# Patient Record
Sex: Female | Born: 1935 | Race: White | Hispanic: No | Marital: Married | Smoking: Never smoker
Health system: Southern US, Community
[De-identification: ages and names within clinical notes are randomized; demographics above are authoritative.]

## PROBLEM LIST (undated history)

## (undated) HISTORY — PX: ABDOMINAL HYSTERECTOMY: SHX81

## (undated) HISTORY — PX: CYST REMOVAL HAND: SHX6279

## (undated) HISTORY — PX: OTHER SURGICAL HISTORY: SHX169

## (undated) HISTORY — PX: APPENDECTOMY: SHX54

---

## 2002-10-27 ENCOUNTER — Encounter: Payer: Self-pay | Admitting: Hematology

## 2002-10-27 ENCOUNTER — Encounter: Admission: RE | Admit: 2002-10-27 | Discharge: 2002-10-27 | Payer: Self-pay | Admitting: Hematology

## 2002-11-15 ENCOUNTER — Other Ambulatory Visit: Admission: RE | Admit: 2002-11-15 | Discharge: 2002-11-15 | Payer: Self-pay | Admitting: Oncology

## 2004-01-25 ENCOUNTER — Ambulatory Visit: Payer: Self-pay | Admitting: Oncology

## 2015-05-29 DIAGNOSIS — N184 Chronic kidney disease, stage 4 (severe): Secondary | ICD-10-CM

## 2015-05-29 DIAGNOSIS — N2581 Secondary hyperparathyroidism of renal origin: Secondary | ICD-10-CM

## 2015-05-29 HISTORY — DX: Secondary hyperparathyroidism of renal origin: N25.81

## 2015-05-29 HISTORY — DX: Chronic kidney disease, stage 4 (severe): N18.4

## 2015-05-30 DIAGNOSIS — D472 Monoclonal gammopathy: Secondary | ICD-10-CM

## 2015-05-30 DIAGNOSIS — M8580 Other specified disorders of bone density and structure, unspecified site: Secondary | ICD-10-CM | POA: Insufficient documentation

## 2015-05-30 DIAGNOSIS — R7989 Other specified abnormal findings of blood chemistry: Secondary | ICD-10-CM

## 2015-05-30 DIAGNOSIS — M1A09X0 Idiopathic chronic gout, multiple sites, without tophus (tophi): Secondary | ICD-10-CM | POA: Insufficient documentation

## 2015-05-30 DIAGNOSIS — K219 Gastro-esophageal reflux disease without esophagitis: Secondary | ICD-10-CM | POA: Insufficient documentation

## 2015-05-30 DIAGNOSIS — G43909 Migraine, unspecified, not intractable, without status migrainosus: Secondary | ICD-10-CM | POA: Insufficient documentation

## 2015-05-30 HISTORY — DX: Migraine, unspecified, not intractable, without status migrainosus: G43.909

## 2015-05-30 HISTORY — DX: Idiopathic chronic gout, multiple sites, without tophus (tophi): M1A.09X0

## 2015-05-30 HISTORY — DX: Gastro-esophageal reflux disease without esophagitis: K21.9

## 2015-05-30 HISTORY — DX: Other specified disorders of bone density and structure, unspecified site: M85.80

## 2015-05-30 HISTORY — DX: Monoclonal gammopathy: D47.2

## 2015-05-30 HISTORY — DX: Other specified abnormal findings of blood chemistry: R79.89

## 2015-11-22 DIAGNOSIS — G2581 Restless legs syndrome: Secondary | ICD-10-CM

## 2015-11-22 DIAGNOSIS — N184 Chronic kidney disease, stage 4 (severe): Secondary | ICD-10-CM | POA: Insufficient documentation

## 2015-11-22 DIAGNOSIS — E1122 Type 2 diabetes mellitus with diabetic chronic kidney disease: Secondary | ICD-10-CM

## 2015-11-22 HISTORY — DX: Restless legs syndrome: G25.81

## 2015-11-22 HISTORY — DX: Chronic kidney disease, stage 4 (severe): N18.4

## 2015-11-22 HISTORY — DX: Chronic kidney disease, stage 4 (severe): E11.22

## 2016-10-05 DIAGNOSIS — T485X5A Adverse effect of other anti-common-cold drugs, initial encounter: Secondary | ICD-10-CM | POA: Insufficient documentation

## 2016-10-05 DIAGNOSIS — J310 Chronic rhinitis: Secondary | ICD-10-CM

## 2016-10-05 HISTORY — DX: Chronic rhinitis: J31.0

## 2016-10-05 HISTORY — DX: Adverse effect of other anti-common-cold drugs, initial encounter: T48.5X5A

## 2017-09-16 DIAGNOSIS — K915 Postcholecystectomy syndrome: Secondary | ICD-10-CM

## 2017-09-16 DIAGNOSIS — M5416 Radiculopathy, lumbar region: Secondary | ICD-10-CM | POA: Insufficient documentation

## 2017-09-16 HISTORY — DX: Radiculopathy, lumbar region: M54.16

## 2017-09-16 HISTORY — DX: Postcholecystectomy syndrome: K91.5

## 2018-12-14 DIAGNOSIS — E782 Mixed hyperlipidemia: Secondary | ICD-10-CM | POA: Insufficient documentation

## 2018-12-14 HISTORY — DX: Mixed hyperlipidemia: E78.2

## 2019-07-20 DIAGNOSIS — R928 Other abnormal and inconclusive findings on diagnostic imaging of breast: Secondary | ICD-10-CM

## 2019-07-20 HISTORY — DX: Other abnormal and inconclusive findings on diagnostic imaging of breast: R92.8

## 2019-09-21 ENCOUNTER — Other Ambulatory Visit: Payer: Self-pay | Admitting: Nephrology

## 2019-09-21 DIAGNOSIS — N184 Chronic kidney disease, stage 4 (severe): Secondary | ICD-10-CM

## 2019-09-27 ENCOUNTER — Other Ambulatory Visit: Payer: Self-pay

## 2019-10-09 ENCOUNTER — Ambulatory Visit
Admission: RE | Admit: 2019-10-09 | Discharge: 2019-10-09 | Disposition: A | Discharge status: Home / Self Care | Payer: Medicare PPO | Source: Ambulatory Visit | Attending: Nephrology | Admitting: Nephrology

## 2019-10-09 DIAGNOSIS — N184 Chronic kidney disease, stage 4 (severe): Secondary | ICD-10-CM

## 2019-12-07 DIAGNOSIS — I674 Hypertensive encephalopathy: Secondary | ICD-10-CM | POA: Insufficient documentation

## 2019-12-07 DIAGNOSIS — I361 Nonrheumatic tricuspid (valve) insufficiency: Secondary | ICD-10-CM | POA: Diagnosis not present

## 2019-12-09 DIAGNOSIS — I4510 Unspecified right bundle-branch block: Secondary | ICD-10-CM | POA: Diagnosis not present

## 2019-12-10 DIAGNOSIS — I4510 Unspecified right bundle-branch block: Secondary | ICD-10-CM | POA: Diagnosis not present

## 2019-12-13 DIAGNOSIS — I161 Hypertensive emergency: Secondary | ICD-10-CM | POA: Insufficient documentation

## 2019-12-13 DIAGNOSIS — G9340 Encephalopathy, unspecified: Secondary | ICD-10-CM | POA: Insufficient documentation

## 2019-12-13 DIAGNOSIS — R4701 Aphasia: Secondary | ICD-10-CM

## 2019-12-13 DIAGNOSIS — R4781 Slurred speech: Secondary | ICD-10-CM | POA: Insufficient documentation

## 2019-12-13 HISTORY — DX: Hypertensive emergency: I16.1

## 2019-12-13 HISTORY — DX: Aphasia: R47.01

## 2019-12-13 HISTORY — DX: Encephalopathy, unspecified: G93.40

## 2019-12-13 HISTORY — DX: Slurred speech: R47.81

## 2019-12-19 ENCOUNTER — Other Ambulatory Visit: Payer: Self-pay

## 2019-12-20 ENCOUNTER — Other Ambulatory Visit: Payer: Self-pay

## 2019-12-20 ENCOUNTER — Ambulatory Visit (INDEPENDENT_AMBULATORY_CARE_PROVIDER_SITE_OTHER): Payer: Medicare PPO

## 2019-12-20 ENCOUNTER — Ambulatory Visit (INDEPENDENT_AMBULATORY_CARE_PROVIDER_SITE_OTHER): Payer: Medicare PPO | Admitting: Cardiology

## 2019-12-20 ENCOUNTER — Encounter: Payer: Self-pay | Admitting: Cardiology

## 2019-12-20 VITALS — BP 124/78 | HR 63 | Ht 65.0 in | Wt 169.8 lb

## 2019-12-20 DIAGNOSIS — I10 Essential (primary) hypertension: Secondary | ICD-10-CM

## 2019-12-20 DIAGNOSIS — Z8673 Personal history of transient ischemic attack (TIA), and cerebral infarction without residual deficits: Secondary | ICD-10-CM

## 2019-12-20 DIAGNOSIS — E8881 Metabolic syndrome: Secondary | ICD-10-CM | POA: Diagnosis not present

## 2019-12-20 DIAGNOSIS — I493 Ventricular premature depolarization: Secondary | ICD-10-CM

## 2019-12-20 NOTE — Progress Notes (Signed)
Cardiology Office Note:    Date:  12/21/2019   ID:  Monica Vega, DOB 08/19/1932, MRN [id number]  PCP:  Maris Berger, MD  Cardiologist:  Berniece Salines, DO  Electrophysiologist:  None   Referring MD: Maris Berger, MD   " I am doing well"  History of Present Illness:    Monica Vega is a 86 y.o. female with a hx of type 2 diabetes, hypertension, hyperlipidemia, monoclonal gammopathy of unknown significance, chronic kidney disease, chronic idiopathic gout and restless leg syndrome is here today to establish cardiac care.  The patient tells me that she was admitted at [facility] after she had concerns for difficulty speaking and her family called EMS.  During her hospital stay elevated for CVA and had CT head which did not show any acute findings.  Neurology did see the patient and recommended MRI which did not show any subsequent CVA.  She did have a EEG which was not definitive for any diagnosis of seizure showed focal slowing of uncertain etiology.  There was concerns on her monitor with intermittent PVCs therefore the patient was asked to see cardiology.  There is note that she may have had nonsustained ventricular tachycardia but there is no monitor prescription or no consultation from cardiology.  It is noted that she was referred to cardiology in the outpatient setting.   She is here today for follow-up visit.  She denies any chest pain, syncope, shortness of breath.   Past Medical History:  Diagnosis Date  . Abnormal mammogram of left breast 07/20/2019  . Abnormal TSH 05/30/2015  . Acute right lumbar radiculopathy 09/16/2017  . Chronic idiopathic gout of multiple sites 05/30/2015  . CKD (chronic kidney disease) stage 4, GFR 15-29 ml/min (HCC) 05/29/2015  . Controlled type 2 diabetes mellitus with stage 4 chronic kidney disease, without long-term current use of insulin ([place]) 11/22/2015  . Encephalopathy 12/13/2019  . Expressive aphasia 12/13/2019  . GERD (gastroesophageal  reflux disease) 05/30/2015  . Hyperlipidemia, mixed 12/14/2018  . Hyperparathyroidism, secondary renal ([place]) 05/29/2015  . Hypertensive emergency 12/13/2019  . MGUS (monoclonal gammopathy of unknown significance) 05/30/2015  . Migraine syndrome 05/30/2015  . Osteopenia 05/30/2015  . Post-cholecystectomy syndrome 09/16/2017  . Restless legs 11/22/2015  . Rhinitis medicamentosa 10/05/2016  . Slurred speech 12/13/2019    History reviewed. No pertinent surgical history.  Current Medications: Current Meds  Medication Sig  . acetaminophen (TYLENOL) 500 MG tablet Take 500 mg by mouth every 6 (six) hours as needed.  Marland Kitchen allopurinol (ZYLOPRIM) 100 MG tablet Take 2 tablets by mouth daily. For gout prevention  . aspirin 81 MG EC tablet Take 81 mg by mouth. 3 times a week  . Calcium Polycarbophil 625 MG CHEW Chew 500 mg by mouth.  . Cholecalciferol 25 MCG (1000 UT) capsule Take 1 capsule by mouth daily.  . Cyanocobalamin 1000 MCG SUBL Place under the tongue daily.  Marland Kitchen esomeprazole (NEXIUM) 40 MG capsule Take 40 mg by mouth daily.  . fluticasone (FLONASE) 50 MCG/ACT nasal spray SHAKE LIQUID AND USE 1 SPRAY IN EACH NOSTRIL DAILY  . furosemide (LASIX) 20 MG tablet TAKE 1 TABLET BY MOUTH EVERY MORNING FOR BLOOD PRESSURE OR FLUID RETENTION  . irbesartan (AVAPRO) 150 MG tablet Take 1 tablet by mouth daily.  Marland Kitchen levothyroxine (SYNTHROID) 50 MCG tablet TAKE 1 TABLET BY MOUTH EVERY DAY FOR THYROID. STOP LEVOTHYROXINE 25 MCG  . magnesium oxide (MAG-OX) 400 MG tablet Take 1 tablet by mouth daily.  Marland Kitchen  Methyl Salicylate-Lido-Menthol 4-4-5 % PTCH Apply topically.  . metoprolol succinate (TOPROL-XL) 100 MG 24 hr tablet Take 1 tablet by mouth daily.  . pravastatin (PRAVACHOL) 40 MG tablet TAKE 1 TABLET BY MOUTH EVERY EVENING WITH FOOD FOR CHOLESTEROL  . Probiotic, Lactobacillus, CAPS Take by mouth.  . trolamine salicylate (ASPERCREME) 10 % cream Apply topically.  . venlafaxine XR (EFFEXOR-XR) 37.5 MG 24 hr capsule TAKE 1  CAPSULE(37.5 MG) BY MOUTH DAILY     Allergies:   Penicillin g, Ace inhibitors, Amlodipine, Atorvastatin, and Erythromycin base   Social History   Socioeconomic History  . Marital status: Married    Spouse name: Not on file  . Number of children: Not on file  . Years of education: Not on file  . Highest education level: Not on file  Occupational History  . Not on file  Tobacco Use  . Smoking status: Never Smoker  . Smokeless tobacco: Never Used  Substance and Sexual Activity  . Alcohol use: Not on file  . Drug use: Not on file  . Sexual activity: Not on file  Other Topics Concern  . Not on file  Social History Narrative  . Not on file   Social Determinants of Health   Financial Resource Strain: Not on file  Food Insecurity: Not on file  Transportation Needs: Not on file  Physical Activity: Not on file  Stress: Not on file  Social Connections: Not on file     Family History: The patient's family history includes Heart Problems in her father; Ovarian cancer in her mother; Stroke in her maternal grandmother.  ROS:   Review of Systems  Constitution: Negative for decreased appetite, fever and weight gain.  HENT: Negative for congestion, ear discharge, hoarse voice and sore throat.   Eyes: Negative for discharge, redness, vision loss in right eye and visual halos.  Cardiovascular: Negative for chest pain, dyspnea on exertion, leg swelling, orthopnea and palpitations.  Respiratory: Negative for cough, hemoptysis, shortness of breath and snoring.   Endocrine: Negative for heat intolerance and polyphagia.  Hematologic/Lymphatic: Negative for bleeding problem. Does not bruise/bleed easily.  Skin: Negative for flushing, nail changes, rash and suspicious lesions.  Musculoskeletal: Negative for arthritis, joint pain, muscle cramps, myalgias, neck pain and stiffness.  Gastrointestinal: Negative for abdominal pain, bowel incontinence, diarrhea and excessive appetite.   Genitourinary: Negative for decreased libido, genital sores and incomplete emptying.  Neurological: Negative for brief paralysis, focal weakness, headaches and loss of balance.  Psychiatric/Behavioral: Negative for altered mental status, depression and suicidal ideas.  Allergic/Immunologic: Negative for HIV exposure and persistent infections.    EKGs/Labs/Other Studies Reviewed:    The following studies were reviewed today:   EKG:  The ekg ordered today demonstrates   Chest x-ray on December 07, 2019 no active disease CT of the head without contrast showed no acute abnormalities. Echocardiogram done on December 08, 2019 shows normal systolic function.  EF 55 to 60%.  Mild mitral regurgitation.  Mild mitral and calcification.  Recent Labs: No results found for requested labs within last 8760 hours.  Recent Lipid Panel No results found for: CHOL, TRIG, HDL, CHOLHDL, VLDL, LDLCALC, LDLDIRECT  Physical Exam:    VS:  BP 124/78 (BP Location: Right Arm)   Pulse 63   Ht 5\' 5"  (1.651 m)   Wt 169 lb 12.8 oz (77 kg)   SpO2 96%   BMI 28.26 kg/m     Wt Readings from Last 3 Encounters:  12/20/19 169 lb 12.8 oz (  77 kg)     GEN: Well nourished, well developed in no acute distress HEENT: Normal NECK: No JVD; No carotid bruits LYMPHATICS: No lymphadenopathy CARDIAC: S1S2 noted,RRR, no murmurs, rubs, gallops RESPIRATORY:  Clear to auscultation without rales, wheezing or rhonchi  ABDOMEN: Soft, non-tender, non-distended, +bowel sounds, no guarding. EXTREMITIES: No edema, No cyanosis, no clubbing MUSCULOSKELETAL:  No deformity  SKIN: Warm and dry NEUROLOGIC:  Alert and oriented x 3, non-focal PSYCHIATRIC:  Normal affect, good insight  ASSESSMENT:    1. PVC (premature ventricular contraction)   2. Metabolic syndrome   3. History of CVA (cerebrovascular accident)   4. Primary hypertension    PLAN:     We will like to do his best to monitor the patient to make sure that she is not  having any significant arrhythmia.  Unfortunately there is no evidence of NSVT but she does have history of PVCs at work, hopefully see what the burden is as well.  She is significantly fatigue but will like to do is get vitamin B 12 and vitamin D levels. Her blood pressure is acceptable I am not going to make any changes to her medications at this time. Continue aspirin and pravastatin  The patient is in agreement with the above plan. The patient left the office in stable condition.  The patient will follow up in   Medication Adjustments/Labs and Tests Ordered: Current medicines are reviewed at length with the patient today.  Concerns regarding medicines are outlined above.  Orders Placed This Encounter  Procedures  . VITAMIN D 25 Hydroxy (Vit-D Deficiency, Fractures)  . B12  . LONG TERM MONITOR (3-14 DAYS)  . EKG 12-Lead   No orders of the defined types were placed in this encounter.   Patient Instructions  Medication Instructions:  Your physician recommends that you continue on your current medications as directed. Please refer to the Current Medication list given to you today.  *If you need a refill on your cardiac medications before your next appointment, please call your pharmacy*   Lab Work: Your physician recommends that you return for lab work in: TODAY Vitamin D, Vitamin B12 If you have labs (blood work) drawn today and your tests are completely normal, you will receive your results only by: Marland Kitchen MyChart Message (if you have MyChart) OR . A paper copy in the mail If you have any lab test that is abnormal or we need to change your treatment, we will call you to review the results.   Testing/Procedures: A zio monitor was ordered today. It will remain on for 14 days. You will then return monitor and event diary in provided box. It takes 1-2 weeks for report to be downloaded and returned to [place]. We will call you with the results. If monitor falls off or has orange flashing  light, please call Zio for further instructions.      Follow-Up: At [facility], you and your health needs are our priority.  As part of our continuing mission to provide you with exceptional heart care, we have created designated Provider Care Teams.  These Care Teams include your primary Cardiologist (physician) and Advanced Practice Providers (APPs -  Physician Assistants and Nurse Practitioners) who all work together to provide you with the care you need, when you need it.  We recommend signing up for the patient portal called "MyChart".  Sign up information is provided on this After Visit Summary.  MyChart is used to connect with patients for Virtual Visits (Telemedicine).  Patients are able to view lab/test results, encounter notes, upcoming appointments, etc.  Non-urgent messages can be sent to your provider as well.   To learn more about what you can do with MyChart, go to [email or web address].    Your next appointment:   6 week(s)  The format for your next appointment:   In Person  Provider:   Berniece Salines, DO   Other Instructions      Adopting a Healthy Lifestyle.  Know what a healthy weight is for you (roughly BMI <25) and aim to maintain this   Aim for 7+ servings of fruits and vegetables daily   65-80+ fluid ounces of water or unsweet tea for healthy kidneys   Limit to max 1 drink of alcohol per day; avoid smoking/tobacco   Limit animal fats in diet for cholesterol and heart health - choose grass fed whenever available   Avoid highly processed foods, and foods high in saturated/trans fats   Aim for low stress - take time to unwind and care for your mental health   Aim for 150 min of moderate intensity exercise weekly for heart health, and weights twice weekly for bone health   Aim for 7-9 hours of sleep daily   When it comes to diets, agreement about the perfect plan isnt easy to find, even among the experts. Experts at the [employer] developed an idea known as the Healthy Eating Plate. Just imagine a plate divided into logical, healthy portions.   The emphasis is on diet quality:   Load up on vegetables and fruits - one-half of your plate: Aim for color and variety, and remember that potatoes dont count.   Go for whole grains - one-quarter of your plate: Whole wheat, barley, wheat berries, quinoa, oats, brown rice, and foods made with them. If you want pasta, go with whole wheat pasta.   Protein power - one-quarter of your plate: Fish, chicken, beans, and nuts are all healthy, versatile protein sources. Limit red meat.   The diet, however, does go beyond the plate, offering a few other suggestions.   Use healthy plant oils, such as olive, canola, soy, corn, sunflower and peanut. Check the labels, and avoid partially hydrogenated oil, which have unhealthy trans fats.   If youre thirsty, drink water. Coffee and tea are good in moderation, but skip sugary drinks and limit milk and dairy products to one or two daily servings.   The type of carbohydrate in the diet is more important than the amount. Some sources of carbohydrates, such as vegetables, fruits, whole grains, and beans-are healthier than others.   Finally, stay active  Signed, Berniece Salines, DO  12/21/2019 8:55 AM    [facility]

## 2019-12-20 NOTE — Patient Instructions (Signed)
Medication Instructions:  Your physician recommends that you continue on your current medications as directed. Please refer to the Current Medication list given to you today.  *If you need a refill on your cardiac medications before your next appointment, please call your pharmacy*   Lab Work: Your physician recommends that you return for lab work in: TODAY Vitamin D, Vitamin B12 If you have labs (blood work) drawn today and your tests are completely normal, you will receive your results only by: Marland Kitchen MyChart Message (if you have MyChart) OR . A paper copy in the mail If you have any lab test that is abnormal or we need to change your treatment, we will call you to review the results.   Testing/Procedures: A zio monitor was ordered today. It will remain on for 14 days. You will then return monitor and event diary in provided box. It takes 1-2 weeks for report to be downloaded and returned to [place]. We will call you with the results. If monitor falls off or has orange flashing light, please call Zio for further instructions.      Follow-Up: At [facility], you and your health needs are our priority.  As part of our continuing mission to provide you with exceptional heart care, we have created designated Provider Care Teams.  These Care Teams include your primary Cardiologist (physician) and Advanced Practice Providers (APPs -  Physician Assistants and Nurse Practitioners) who all work together to provide you with the care you need, when you need it.  We recommend signing up for the patient portal called "MyChart".  Sign up information is provided on this After Visit Summary.  MyChart is used to connect with patients for Virtual Visits (Telemedicine).  Patients are able to view lab/test results, encounter notes, upcoming appointments, etc.  Non-urgent messages can be sent to your provider as well.   To learn more about what you can do with MyChart, go to [email or web address].    Your next  appointment:   6 week(s)  The format for your next appointment:   In Person  Provider:   Berniece Salines, DO   Other Instructions

## 2019-12-21 ENCOUNTER — Telehealth: Payer: Self-pay | Admitting: Cardiology

## 2019-12-21 DIAGNOSIS — I493 Ventricular premature depolarization: Secondary | ICD-10-CM

## 2019-12-21 DIAGNOSIS — I10 Essential (primary) hypertension: Secondary | ICD-10-CM

## 2019-12-21 DIAGNOSIS — Z8673 Personal history of transient ischemic attack (TIA), and cerebral infarction without residual deficits: Secondary | ICD-10-CM | POA: Insufficient documentation

## 2019-12-21 DIAGNOSIS — E8881 Metabolic syndrome: Secondary | ICD-10-CM | POA: Insufficient documentation

## 2019-12-21 HISTORY — DX: Essential (primary) hypertension: I10

## 2019-12-21 HISTORY — DX: Ventricular premature depolarization: I49.3

## 2019-12-21 LAB — VITAMIN B12: Vitamin B-12: 654 pg/mL (ref 232–1245)

## 2019-12-21 LAB — VITAMIN D 25 HYDROXY (VIT D DEFICIENCY, FRACTURES): Vit D, 25-Hydroxy: 34.6 ng/mL (ref 30.0–100.0)

## 2019-12-21 NOTE — Telephone Encounter (Signed)
Updated BP readings:  12-6: BP 171/77, 157/79, 149/80, 137/78, 143/72, 171/75, 159/79  12-16: 164/97, 143/83

## 2019-12-22 ENCOUNTER — Telehealth: Payer: Self-pay

## 2019-12-22 MED ORDER — CARVEDILOL 6.25 MG PO TABS: 6.2500 mg | ORAL_TABLET | Freq: Two times a day (BID) | ORAL | 3 refills | Status: DC

## 2019-12-22 NOTE — Telephone Encounter (Signed)
Please stop the Toprol-XL.  And have the patient start carvedilol 6.25 mg twice daily.

## 2019-12-22 NOTE — Addendum Note (Signed)
Addended by: Resa Miner I on: 12/22/2019 08:50 AM   Modules accepted: Orders

## 2019-12-22 NOTE — Telephone Encounter (Signed)
-----   Message from Berniece Salines, DO sent at 12/22/2019 12:39 PM EST ----- Labs normal

## 2019-12-22 NOTE — Telephone Encounter (Signed)
Spoke to the patient just now and let her know these recommendations. She verbalizes understanding and thanks me for the call back.    Encouraged patient to call back with any questions or concerns.

## 2019-12-22 NOTE — Telephone Encounter (Signed)
Patient notified of lab results

## 2020-01-03 DIAGNOSIS — I493 Ventricular premature depolarization: Secondary | ICD-10-CM

## 2020-01-10 DIAGNOSIS — E538 Deficiency of other specified B group vitamins: Secondary | ICD-10-CM | POA: Insufficient documentation

## 2020-02-01 ENCOUNTER — Other Ambulatory Visit: Payer: Self-pay

## 2020-02-01 ENCOUNTER — Ambulatory Visit (INDEPENDENT_AMBULATORY_CARE_PROVIDER_SITE_OTHER): Payer: Medicare PPO | Admitting: Cardiology

## 2020-02-01 ENCOUNTER — Encounter: Payer: Self-pay | Admitting: Cardiology

## 2020-02-01 VITALS — BP 122/60 | HR 66 | Ht 65.0 in | Wt 167.4 lb

## 2020-02-01 DIAGNOSIS — R0609 Other forms of dyspnea: Secondary | ICD-10-CM

## 2020-02-01 DIAGNOSIS — I471 Supraventricular tachycardia: Secondary | ICD-10-CM

## 2020-02-01 DIAGNOSIS — R072 Precordial pain: Secondary | ICD-10-CM | POA: Insufficient documentation

## 2020-02-01 DIAGNOSIS — I472 Ventricular tachycardia: Secondary | ICD-10-CM | POA: Diagnosis not present

## 2020-02-01 DIAGNOSIS — I10 Essential (primary) hypertension: Secondary | ICD-10-CM

## 2020-02-01 DIAGNOSIS — N1831 Chronic kidney disease, stage 3a: Secondary | ICD-10-CM

## 2020-02-01 DIAGNOSIS — I4729 Other ventricular tachycardia: Secondary | ICD-10-CM

## 2020-02-01 DIAGNOSIS — I4719 Other supraventricular tachycardia: Secondary | ICD-10-CM | POA: Insufficient documentation

## 2020-02-01 DIAGNOSIS — R0600 Dyspnea, unspecified: Secondary | ICD-10-CM | POA: Diagnosis not present

## 2020-02-01 HISTORY — DX: Ventricular tachycardia: I47.2

## 2020-02-01 HISTORY — DX: Supraventricular tachycardia: I47.1

## 2020-02-01 HISTORY — DX: Other ventricular tachycardia: I47.29

## 2020-02-01 MED ORDER — METOPROLOL SUCCINATE ER 25 MG PO TB24: 12.5000 mg | ORAL_TABLET | Freq: Every evening | ORAL | 3 refills | Status: DC

## 2020-02-01 NOTE — Patient Instructions (Signed)
Medication Instructions:  Your physician has recommended you make the following change in your medication:  START: Toprol XL 12.5 mg. Take half a tablet by mouth at night  *If you need a refill on your cardiac medications before your next appointment, please call your pharmacy*   Lab Work: None If you have labs (blood work) drawn today and your tests are completely normal, you will receive your results only by: Monica Vega MyChart Message (if you have MyChart) OR . A paper copy in the mail If you have any lab test that is abnormal or we need to change your treatment, we will call you to review the results.   Testing/Procedures: Your physician has requested that you have a lexiscan myoview. For further information please visit [email or web address]. Please follow instruction sheet, as given.     Follow-Up: At [facility], you and your health needs are our priority.  As part of our continuing mission to provide you with exceptional heart care, we have created designated Provider Care Teams.  These Care Teams include your primary Cardiologist (physician) and Advanced Practice Providers (APPs -  Physician Assistants and Nurse Practitioners) who all work together to provide you with the care you need, when you need it.  We recommend signing up for the patient portal called "MyChart".  Sign up information is provided on this After Visit Summary.  MyChart is used to connect with patients for Virtual Visits (Telemedicine).  Patients are able to view lab/test results, encounter notes, upcoming appointments, etc.  Non-urgent messages can be sent to your provider as well.   To learn more about what you can do with MyChart, go to [email or web address].    Your next appointment:   8 week(s)  The format for your next appointment:   In Person  Provider:   Berniece Salines, DO   Other Instructions  Cardiac Nuclear Scan A cardiac nuclear scan is a test that is done to check the flow of blood to your  heart. It is done when you are resting and when you are exercising. The test looks for problems such as:  Not enough blood reaching a portion of the heart.  The heart muscle not working as it should. You may need this test if:  You have heart disease.  You have had lab results that are not normal.  You have had heart surgery or a balloon procedure to open up blocked arteries (angioplasty).  You have chest pain.  You have shortness of breath. In this test, a special dye (tracer) is put into your bloodstream. The tracer will travel to your heart. A camera will then take pictures of your heart to see how the tracer moves through your heart. This test is usually done at a hospital and takes 2-4 hours. Tell a doctor about:  Any allergies you have.  All medicines you are taking, including vitamins, herbs, eye drops, creams, and over-the-counter medicines.  Any problems you or family members have had with anesthetic medicines.  Any blood disorders you have.  Any surgeries you have had.  Any medical conditions you have.  Whether you are pregnant or may be pregnant. What are the risks? Generally, this is a safe test. However, problems may occur, such as:  Serious chest pain and heart attack. This is only a risk if the stress portion of the test is done.  Rapid heartbeat.  A feeling of warmth in your chest. This feeling usually does not last long.  Allergic reaction to the tracer. What  happens before the test?  Ask your doctor about changing or stopping your normal medicines. This is important.  Follow instructions from your doctor about what you cannot eat or drink.  Remove your jewelry on the day of the test. What happens during the test?  An IV tube will be inserted into one of your veins.  Your doctor will give you a small amount of tracer through the IV tube.  You will wait for 20-40 minutes while the tracer moves through your bloodstream.  Your heart will be  monitored with an electrocardiogram (ECG).  You will lie down on an exam table.  Pictures of your heart will be taken for about 15-20 minutes.  You may also have a stress test. For this test, one of these things may be done: ? You will be asked to exercise on a treadmill or a stationary bike. ? You will be given medicines that will make your heart work harder. This is done if you are unable to exercise.  When blood flow to your heart has peaked, a tracer will again be given through the IV tube.  After 20-40 minutes, you will get back on the exam table. More pictures will be taken of your heart.  Depending on the tracer that is used, more pictures may need to be taken 3-4 hours later.  Your IV tube will be removed when the test is over. The test may vary among doctors and hospitals. What happens after the test?  Ask your doctor: ? Whether you can return to your normal schedule, including diet, activities, and medicines. ? Whether you should drink more fluids. This will help to remove the tracer from your body. Drink enough fluid to keep your pee (urine) pale yellow.  Ask your doctor, or the department that is doing the test: ? When will my results be ready? ? How will I get my results? Summary  A cardiac nuclear scan is a test that is done to check the flow of blood to your heart.  Tell your doctor whether you are pregnant or may be pregnant.  Before the test, ask your doctor about changing or stopping your normal medicines. This is important.  Ask your doctor whether you can return to your normal activities. You may be asked to drink more fluids. This information is not intended to replace advice given to you by your health care provider. Make sure you discuss any questions you have with your health care provider. Document Revised: 04/13/2018 Document Reviewed: 06/07/2017 Elsevier Patient Education  [place].

## 2020-02-01 NOTE — Progress Notes (Signed)
Cardiology Office Note:    Date:  02/01/2020   ID:  [name], DOB [birth year], MRN [id number]  PCP:  Maris Berger, MD  Cardiologist:  Berniece Salines, DO  Electrophysiologist:  None   Referring MD: Maris Berger, MD   I have had some shortness of breath but I am doing fine overall  History of Present Illness:    Monica Vega is a 87 y.o. female with a hx of of type 2 diabetes, hypertension, hyperlipidemia, monoclonal gammopathy of unknown significance, chronic kidney disease, chronic idiopathic gout and restless leg syndrome.  At her last visit we will place a monitor and patient understand her burden of PVCs.  She is able to wear her monitor she is here for follow-up visit.  The patient tells me that she has had some shortness of breath no other complaints at this time.     Past Medical History:  Diagnosis Date  . Abnormal mammogram of left breast 07/20/2019  . Abnormal TSH 05/30/2015  . Acute right lumbar radiculopathy 09/16/2017  . Chronic idiopathic gout of multiple sites 05/30/2015  . CKD (chronic kidney disease) stage 4, GFR 15-29 ml/min (HCC) 05/29/2015  . Controlled type 2 diabetes mellitus with stage 4 chronic kidney disease, without long-term current use of insulin ([place]) 11/22/2015  . Encephalopathy 12/13/2019  . Expressive aphasia 12/13/2019  . GERD (gastroesophageal reflux disease) 05/30/2015  . Hyperlipidemia, mixed 12/14/2018  . Hyperparathyroidism, secondary renal ([place]) 05/29/2015  . Hypertensive emergency 12/13/2019  . MGUS (monoclonal gammopathy of unknown significance) 05/30/2015  . Migraine syndrome 05/30/2015  . Osteopenia 05/30/2015  . Post-cholecystectomy syndrome 09/16/2017  . Restless legs 11/22/2015  . Rhinitis medicamentosa 10/05/2016  . Slurred speech 12/13/2019    Past Surgical History:  Procedure Laterality Date  . ABDOMINAL HYSTERECTOMY    . APPENDECTOMY    . CYST REMOVAL HAND    . Gallbladder removal       Current Medications: Current Meds   Medication Sig  . acetaminophen (TYLENOL) 500 MG tablet Take 500 mg by mouth every 6 (six) hours as needed.  Marland Kitchen allopurinol (ZYLOPRIM) 100 MG tablet Take 2 tablets by mouth daily. For gout prevention  . aspirin 81 MG EC tablet Take 81 mg by mouth. 3 times a week  . Calcium Polycarbophil 625 MG CHEW Chew 500 mg by mouth.  . Cholecalciferol 25 MCG (1000 UT) capsule Take 1 capsule by mouth daily.  . Cyanocobalamin 1000 MCG SUBL Place under the tongue daily.  Marland Kitchen esomeprazole (NEXIUM) 40 MG capsule Take 40 mg by mouth daily.  . fluticasone (FLONASE) 50 MCG/ACT nasal spray SHAKE LIQUID AND USE 1 SPRAY IN EACH NOSTRIL DAILY  . furosemide (LASIX) 20 MG tablet TAKE 1 TABLET BY MOUTH EVERY MORNING FOR BLOOD PRESSURE OR FLUID RETENTION  . irbesartan (AVAPRO) 150 MG tablet Take 1 tablet by mouth daily.  Marland Kitchen levothyroxine (SYNTHROID) 50 MCG tablet TAKE 1 TABLET BY MOUTH EVERY DAY FOR THYROID. STOP LEVOTHYROXINE 25 MCG  . magnesium oxide (MAG-OX) 400 MG tablet Take 1 tablet by mouth daily.  . Methyl Salicylate-Lido-Menthol 4-4-5 % PTCH Apply topically.  . metoprolol succinate (TOPROL XL) 25 MG 24 hr tablet Take 0.5 tablets (12.5 mg total) by mouth at bedtime.  . pravastatin (PRAVACHOL) 40 MG tablet TAKE 1 TABLET BY MOUTH EVERY EVENING WITH FOOD FOR CHOLESTEROL  . Probiotic, Lactobacillus, CAPS Take by mouth.  . trolamine salicylate (ASPERCREME) 10 % cream Apply topically.  . venlafaxine XR (EFFEXOR-XR) 37.5 MG 24  hr capsule TAKE 1 CAPSULE(37.5 MG) BY MOUTH DAILY  . [DISCONTINUED] carvedilol (COREG) 6.25 MG tablet Take 1 tablet (6.25 mg total) by mouth 2 (two) times daily.     Allergies:   Penicillin g, Ace inhibitors, Amlodipine, Atorvastatin, and Erythromycin base   Social History   Socioeconomic History  . Marital status: Married    Spouse name: Not on file  . Number of children: Not on file  . Years of education: Not on file  . Highest education level: Not on file  Occupational History  . Not on  file  Tobacco Use  . Smoking status: Never Smoker  . Smokeless tobacco: Never Used  Substance and Sexual Activity  . Alcohol use: Not on file  . Drug use: Not on file  . Sexual activity: Not on file  Other Topics Concern  . Not on file  Social History Narrative  . Not on file   Social Determinants of Health   Financial Resource Strain: Not on file  Food Insecurity: Not on file  Transportation Needs: Not on file  Physical Activity: Not on file  Stress: Not on file  Social Connections: Not on file     Family History: The patient's family history includes Heart Problems in her father; Ovarian cancer in her mother; Stroke in her maternal grandmother.  ROS:   Review of Systems  Constitution: Negative for decreased appetite, fever and weight gain.  HENT: Negative for congestion, ear discharge, hoarse voice and sore throat.   Eyes: Negative for discharge, redness, vision loss in right eye and visual halos.  Cardiovascular: Negative for chest pain, dyspnea on exertion, leg swelling, orthopnea and palpitations.  Respiratory: Negative for cough, hemoptysis, shortness of breath and snoring.   Endocrine: Negative for heat intolerance and polyphagia.  Hematologic/Lymphatic: Negative for bleeding problem. Does not bruise/bleed easily.  Skin: Negative for flushing, nail changes, rash and suspicious lesions.  Musculoskeletal: Negative for arthritis, joint pain, muscle cramps, myalgias, neck pain and stiffness.  Gastrointestinal: Negative for abdominal pain, bowel incontinence, diarrhea and excessive appetite.  Genitourinary: Negative for decreased libido, genital sores and incomplete emptying.  Neurological: Negative for brief paralysis, focal weakness, headaches and loss of balance.  Psychiatric/Behavioral: Negative for altered mental status, depression and suicidal ideas.  Allergic/Immunologic: Negative for HIV exposure and persistent infections.    EKGs/Labs/Other Studies Reviewed:     The following studies were reviewed today:   EKG: None today  ZIO monitor The patient wore the monitor for 13 days starting December 20, 2019. Indication: PVCs  The minimum heart rate was 44 bpm, maximum heart rate was 162 bpm, and average heart rate was 61 bpm. Predominant underlying rhythm was Sinus Rhythm.  2 Ventricular Tachycardia runs occurred, the run with the fastest interval lasting 4 beats with a maximum rate of 162 bpm, the longest lasting 7 beats with an average rate of 122 bpm.  47 Supraventricular Tachycardia runs occurred, the run with the fastest interval lasting 5 beats with a maximum rate of 156 bpm, the longest lasting 27.9 seconds with an average rate of 127 bpm.   Premature atrial complexes were rare less than 1% Premature Ventricular complexes were rare less than 1%  No pauses, No AV block and no atrial fibrillation present.   Conclusion: This study is remarkable for the following:  1. Nonsustained ventricular tachycardia                                  2. supraventricular tachycardia which is likely paroxysmal atrial tachycardia with variable block.    Recent Labs: No results found for requested labs within last 8760 hours.  Recent Lipid Panel No results found for: CHOL, TRIG, HDL, CHOLHDL, VLDL, LDLCALC, LDLDIRECT  Physical Exam:    VS:  BP 122/60   Pulse 66   Ht 5\' 5"  (1.651 m)   Wt 167 lb 6.4 oz (75.9 kg)   SpO2 93%   BMI 27.86 kg/m     Wt Readings from Last 3 Encounters:  02/01/20 167 lb 6.4 oz (75.9 kg)  12/20/19 169 lb 12.8 oz (77 kg)     GEN: Well nourished, well developed in no acute distress HEENT: Normal NECK: No JVD; No carotid bruits LYMPHATICS: No lymphadenopathy CARDIAC: S1S2 noted,RRR, no murmurs, rubs, gallops RESPIRATORY:  Clear to auscultation without rales, wheezing or rhonchi  ABDOMEN: Soft, non-tender, non-distended, +bowel sounds, no guarding. EXTREMITIES: No edema, No  cyanosis, no clubbing MUSCULOSKELETAL:  No deformity  SKIN: Warm and dry NEUROLOGIC:  Alert and oriented x 3, non-focal PSYCHIATRIC:  Normal affect, good insight  ASSESSMENT:    1. PAT (paroxysmal atrial tachycardia) ([place])   2. Dyspnea on exertion   3. NSVT (nonsustained ventricular tachycardia) ([place])   4. Primary hypertension   5. Stage 3a chronic kidney disease ([place])    PLAN:     1.  For shortness of breath and dyspnea on exertion like to proceed with an ischemic evaluation in this patient as she is intermediate risk.  We discussed a Carlton Adam will be appropriate at this time.  Have educated patient about the test and she is agreeable to proceed.  2.  We also talked about the paroxysmal atrial tachycardia and her nonsustained ventricular tachycardia we will start the patient on low-dose beta-blocker Toprol-XL 12.5 mg daily.   Her blood pressure is acceptable in the office today no changes will be made.   The patient is in agreement with the above plan. The patient left the office in stable condition.  The patient will follow up in 8 weeks or sooner if needed.   Medication Adjustments/Labs and Tests Ordered: Current medicines are reviewed at length with the patient today.  Concerns regarding medicines are outlined above.  Orders Placed This Encounter  Procedures  . MYOCARDIAL PERFUSION IMAGING   Meds ordered this encounter  Medications  . metoprolol succinate (TOPROL XL) 25 MG 24 hr tablet    Sig: Take 0.5 tablets (12.5 mg total) by mouth at bedtime.    Dispense:  30 tablet    Refill:  3    Patient Instructions  Medication Instructions:  Your physician has recommended you make the following change in your medication:  START: Toprol XL 12.5 mg. Take half a tablet by mouth at night  *If you need a refill on your cardiac medications before your next appointment, please call your pharmacy*   Lab Work: None If you have labs (blood work) drawn today and your tests are  completely normal, you will receive your results only by: Marland Kitchen MyChart Message (if you have MyChart) OR . A paper copy in the mail If you have any lab test that is abnormal or we need to change your treatment, we will call you to review the results.  Testing/Procedures: Your physician has requested that you have a lexiscan myoview. For further information please visit [email or web address]. Please follow instruction sheet, as given.     Follow-Up: At [facility], you and your health needs are our priority.  As part of our continuing mission to provide you with exceptional heart care, we have created designated Provider Care Teams.  These Care Teams include your primary Cardiologist (physician) and Advanced Practice Providers (APPs -  Physician Assistants and Nurse Practitioners) who all work together to provide you with the care you need, when you need it.  We recommend signing up for the patient portal called "MyChart".  Sign up information is provided on this After Visit Summary.  MyChart is used to connect with patients for Virtual Visits (Telemedicine).  Patients are able to view lab/test results, encounter notes, upcoming appointments, etc.  Non-urgent messages can be sent to your provider as well.   To learn more about what you can do with MyChart, go to [email or web address].    Your next appointment:   8 week(s)  The format for your next appointment:   In Person  Provider:   Berniece Salines, DO   Other Instructions  Cardiac Nuclear Scan A cardiac nuclear scan is a test that is done to check the flow of blood to your heart. It is done when you are resting and when you are exercising. The test looks for problems such as:  Not enough blood reaching a portion of the heart.  The heart muscle not working as it should. You may need this test if:  You have heart disease.  You have had lab results that are not normal.  You have had heart surgery or a balloon procedure to open up  blocked arteries (angioplasty).  You have chest pain.  You have shortness of breath. In this test, a special dye (tracer) is put into your bloodstream. The tracer will travel to your heart. A camera will then take pictures of your heart to see how the tracer moves through your heart. This test is usually done at a hospital and takes 2-4 hours. Tell a doctor about:  Any allergies you have.  All medicines you are taking, including vitamins, herbs, eye drops, creams, and over-the-counter medicines.  Any problems you or family members have had with anesthetic medicines.  Any blood disorders you have.  Any surgeries you have had.  Any medical conditions you have.  Whether you are pregnant or may be pregnant. What are the risks? Generally, this is a safe test. However, problems may occur, such as:  Serious chest pain and heart attack. This is only a risk if the stress portion of the test is done.  Rapid heartbeat.  A feeling of warmth in your chest. This feeling usually does not last long.  Allergic reaction to the tracer. What happens before the test?  Ask your doctor about changing or stopping your normal medicines. This is important.  Follow instructions from your doctor about what you cannot eat or drink.  Remove your jewelry on the day of the test. What happens during the test?  An IV tube will be inserted into one of your veins.  Your doctor will give you a small amount of tracer through the IV tube.  You will wait for 20-40 minutes while the tracer moves through your bloodstream.  Your heart will be monitored with an electrocardiogram (ECG).  You will lie down on an exam table.  Pictures of your heart will be taken  for about 15-20 minutes.  You may also have a stress test. For this test, one of these things may be done: ? You will be asked to exercise on a treadmill or a stationary bike. ? You will be given medicines that will make your heart work harder. This  is done if you are unable to exercise.  When blood flow to your heart has peaked, a tracer will again be given through the IV tube.  After 20-40 minutes, you will get back on the exam table. More pictures will be taken of your heart.  Depending on the tracer that is used, more pictures may need to be taken 3-4 hours later.  Your IV tube will be removed when the test is over. The test may vary among doctors and hospitals. What happens after the test?  Ask your doctor: ? Whether you can return to your normal schedule, including diet, activities, and medicines. ? Whether you should drink more fluids. This will help to remove the tracer from your body. Drink enough fluid to keep your pee (urine) pale yellow.  Ask your doctor, or the department that is doing the test: ? When will my results be ready? ? How will I get my results? Summary  A cardiac nuclear scan is a test that is done to check the flow of blood to your heart.  Tell your doctor whether you are pregnant or may be pregnant.  Before the test, ask your doctor about changing or stopping your normal medicines. This is important.  Ask your doctor whether you can return to your normal activities. You may be asked to drink more fluids. This information is not intended to replace advice given to you by your health care provider. Make sure you discuss any questions you have with your health care provider. Document Revised: 04/13/2018 Document Reviewed: 06/07/2017 Elsevier Patient Education  2021 [place].      Adopting a Healthy Lifestyle.  Know what a healthy weight is for you (roughly BMI <25) and aim to maintain this   Aim for 7+ servings of fruits and vegetables daily   65-80+ fluid ounces of water or unsweet tea for healthy kidneys   Limit to max 1 drink of alcohol per day; avoid smoking/tobacco   Limit animal fats in diet for cholesterol and heart health - choose grass fed whenever available   Avoid highly  processed foods, and foods high in saturated/trans fats   Aim for low stress - take time to unwind and care for your mental health   Aim for 150 min of moderate intensity exercise weekly for heart health, and weights twice weekly for bone health   Aim for 7-9 hours of sleep daily   When it comes to diets, agreement about the perfect plan isnt easy to find, even among the experts. Experts at the [employer] developed an idea known as the Healthy Eating Plate. Just imagine a plate divided into logical, healthy portions.   The emphasis is on diet quality:   Load up on vegetables and fruits - one-half of your plate: Aim for color and variety, and remember that potatoes dont count.   Go for whole grains - one-quarter of your plate: Whole wheat, barley, wheat berries, quinoa, oats, brown rice, and foods made with them. If you want pasta, go with whole wheat pasta.   Protein power - one-quarter of your plate: Fish, chicken, beans, and nuts are all healthy, versatile protein sources. Limit red meat.  The diet, however, does go beyond the plate, offering a few other suggestions.   Use healthy plant oils, such as olive, canola, soy, corn, sunflower and peanut. Check the labels, and avoid partially hydrogenated oil, which have unhealthy trans fats.   If youre thirsty, drink water. Coffee and tea are good in moderation, but skip sugary drinks and limit milk and dairy products to one or two daily servings.   The type of carbohydrate in the diet is more important than the amount. Some sources of carbohydrates, such as vegetables, fruits, whole grains, and beans-are healthier than others.   Finally, stay active  Signed, Berniece Salines, DO  02/01/2020 3:42 PM    [facility]

## 2020-02-02 ENCOUNTER — Other Ambulatory Visit: Payer: Self-pay | Admitting: Nephrology

## 2020-02-02 DIAGNOSIS — N281 Cyst of kidney, acquired: Secondary | ICD-10-CM

## 2020-02-02 DIAGNOSIS — N184 Chronic kidney disease, stage 4 (severe): Secondary | ICD-10-CM

## 2020-04-03 ENCOUNTER — Encounter: Payer: Self-pay | Admitting: Cardiology

## 2020-04-03 ENCOUNTER — Ambulatory Visit (INDEPENDENT_AMBULATORY_CARE_PROVIDER_SITE_OTHER): Payer: Medicare PPO | Admitting: Cardiology

## 2020-04-03 ENCOUNTER — Other Ambulatory Visit: Payer: Self-pay

## 2020-04-03 VITALS — BP 138/68 | HR 88 | Ht 65.0 in | Wt 172.6 lb

## 2020-04-03 DIAGNOSIS — I493 Ventricular premature depolarization: Secondary | ICD-10-CM

## 2020-04-03 DIAGNOSIS — I471 Supraventricular tachycardia: Secondary | ICD-10-CM | POA: Diagnosis not present

## 2020-04-03 DIAGNOSIS — N184 Chronic kidney disease, stage 4 (severe): Secondary | ICD-10-CM

## 2020-04-03 DIAGNOSIS — E1122 Type 2 diabetes mellitus with diabetic chronic kidney disease: Secondary | ICD-10-CM | POA: Diagnosis not present

## 2020-04-03 DIAGNOSIS — E782 Mixed hyperlipidemia: Secondary | ICD-10-CM

## 2020-04-03 DIAGNOSIS — Z8673 Personal history of transient ischemic attack (TIA), and cerebral infarction without residual deficits: Secondary | ICD-10-CM

## 2020-04-03 MED ORDER — METOPROLOL SUCCINATE ER 25 MG PO TB24: 12.5000 mg | ORAL_TABLET | Freq: Every evening | ORAL | 3 refills | Status: DC

## 2020-04-03 NOTE — Patient Instructions (Addendum)

## 2020-04-03 NOTE — Progress Notes (Signed)
Cardiology Office Note:    Date:  04/03/2020   ID:  Monica Vega, DOB 1932/01/24, MRN [id number]  PCP:  Maris Berger, MD  Cardiologist:  Berniece Salines, DO  Electrophysiologist:  None   Referring MD: Maris Berger, MD   I am doing a lot better  History of Present Illness:    Monica Vega is a 87 y.o. female with a hx of of type 2 diabetes, hypertension, hyperlipidemia, monoclonal gammopathy of unknown significance, chronic kidney disease, chronic idiopathic gout and restless leg syndrome.  To see the patient in January 2022 at that time we recommended a stress test given her shortness of breath on exertion.  In addition we discussed her monitor results which show evidence of paroxysmal atrial tachycardia and nonsustained ventricular tachycardia.  Started patient on Toprol-XL.    She is here today for follow-up visit.  Reported that she had not been able to get her CT scan done.  Past Medical History:  Diagnosis Date  . Abnormal mammogram of left breast 07/20/2019  . Abnormal TSH 05/30/2015  . Acute right lumbar radiculopathy 09/16/2017  . Chronic idiopathic gout of multiple sites 05/30/2015  . CKD (chronic kidney disease) stage 4, GFR 15-29 ml/min (HCC) 05/29/2015  . Controlled type 2 diabetes mellitus with stage 4 chronic kidney disease, without long-term current use of insulin ([place]) 11/22/2015  . Encephalopathy 12/13/2019  . Expressive aphasia 12/13/2019  . GERD (gastroesophageal reflux disease) 05/30/2015  . Hyperlipidemia, mixed 12/14/2018  . Hyperparathyroidism, secondary renal ([place]) 05/29/2015  . Hypertensive emergency 12/13/2019  . MGUS (monoclonal gammopathy of unknown significance) 05/30/2015  . Migraine syndrome 05/30/2015  . Osteopenia 05/30/2015  . PAT (paroxysmal atrial tachycardia) ([place]) 02/01/2020  . Post-cholecystectomy syndrome 09/16/2017  . Primary hypertension 12/21/2019  . PVC (premature ventricular contraction) 12/21/2019  . Restless legs 11/22/2015  . Rhinitis  medicamentosa 10/05/2016  . Slurred speech 12/13/2019  . Ventricular tachycardia, non-sustained ([place]) 02/01/2020    Past Surgical History:  Procedure Laterality Date  . ABDOMINAL HYSTERECTOMY    . APPENDECTOMY    . CYST REMOVAL HAND    . Gallbladder removal       Current Medications: Current Meds  Medication Sig  . acetaminophen (TYLENOL) 500 MG tablet Take 500 mg by mouth every 6 (six) hours as needed for moderate pain or mild pain.  Marland Kitchen allopurinol (ZYLOPRIM) 100 MG tablet Take 2 tablets by mouth daily. For gout prevention  . aspirin 81 MG EC tablet Take 81 mg by mouth daily. 3 times a week  . Calcium Polycarbophil 625 MG CHEW Chew 500 mg by mouth daily.  . Cholecalciferol 25 MCG (1000 UT) capsule Take 1 capsule by mouth daily.  . Cyanocobalamin 1000 MCG SUBL Place under the tongue daily.  Marland Kitchen esomeprazole (NEXIUM) 40 MG capsule Take 40 mg by mouth daily.  . fluticasone (FLONASE) 50 MCG/ACT nasal spray SHAKE LIQUID AND USE 1 SPRAY IN EACH NOSTRIL DAILY  . furosemide (LASIX) 20 MG tablet TAKE 1 TABLET BY MOUTH EVERY MORNING FOR BLOOD PRESSURE OR FLUID RETENTION  . irbesartan (AVAPRO) 150 MG tablet Take 1 tablet by mouth daily.  Marland Kitchen levothyroxine (SYNTHROID) 50 MCG tablet TAKE 1 TABLET BY MOUTH EVERY DAY FOR THYROID. STOP LEVOTHYROXINE 25 MCG  . magnesium oxide (MAG-OX) 400 MG tablet Take 1 tablet by mouth daily.  . Methyl Salicylate-Lido-Menthol 4-4-5 % PTCH Apply 1 application topically daily.  . pravastatin (PRAVACHOL) 40 MG tablet TAKE 1 TABLET BY MOUTH EVERY EVENING WITH FOOD  FOR CHOLESTEROL  . Probiotic, Lactobacillus, CAPS Take 1 tablet by mouth daily.  Marland Kitchen trolamine salicylate (ASPERCREME) 10 % cream Apply 1 application topically 2 (two) times daily as needed for muscle pain.  Marland Kitchen venlafaxine XR (EFFEXOR-XR) 37.5 MG 24 hr capsule TAKE 1 CAPSULE(37.5 MG) BY MOUTH DAILY  . [DISCONTINUED] metoprolol succinate (TOPROL XL) 25 MG 24 hr tablet Take 0.5 tablets (12.5 mg total) by mouth at  bedtime.     Allergies:   Penicillin g, Ace inhibitors, Amlodipine, Atorvastatin, and Erythromycin base   Social History   Socioeconomic History  . Marital status: Married    Spouse name: Not on file  . Number of children: Not on file  . Years of education: Not on file  . Highest education level: Not on file  Occupational History  . Not on file  Tobacco Use  . Smoking status: Never Smoker  . Smokeless tobacco: Never Used  Substance and Sexual Activity  . Alcohol use: Not on file  . Drug use: Not on file  . Sexual activity: Not on file  Other Topics Concern  . Not on file  Social History Narrative  . Not on file   Social Determinants of Health   Financial Resource Strain: Not on file  Food Insecurity: Not on file  Transportation Needs: Not on file  Physical Activity: Not on file  Stress: Not on file  Social Connections: Not on file     Family History: The patient's family history includes Heart Problems in her father; Ovarian cancer in her mother; Stroke in her maternal grandmother.  ROS:   Review of Systems  Constitution: Negative for decreased appetite, fever and weight gain.  HENT: Negative for congestion, ear discharge, hoarse voice and sore throat.   Eyes: Negative for discharge, redness, vision loss in right eye and visual halos.  Cardiovascular: Negative for chest pain, dyspnea on exertion, leg swelling, orthopnea and palpitations.  Respiratory: Negative for cough, hemoptysis, shortness of breath and snoring.   Endocrine: Negative for heat intolerance and polyphagia.  Hematologic/Lymphatic: Negative for bleeding problem. Does not bruise/bleed easily.  Skin: Negative for flushing, nail changes, rash and suspicious lesions.  Musculoskeletal: Negative for arthritis, joint pain, muscle cramps, myalgias, neck pain and stiffness.  Gastrointestinal: Negative for abdominal pain, bowel incontinence, diarrhea and excessive appetite.  Genitourinary: Negative for  decreased libido, genital sores and incomplete emptying.  Neurological: Negative for brief paralysis, focal weakness, headaches and loss of balance.  Psychiatric/Behavioral: Negative for altered mental status, depression and suicidal ideas.  Allergic/Immunologic: Negative for HIV exposure and persistent infections.    EKGs/Labs/Other Studies Reviewed:    The following studies were reviewed today:   EKG: None today   ZIO monitor The patient wore the monitor for 13 days starting December 20, 2019. Indication: PVCs  The minimum heart rate was 44 bpm, maximum heart rate was 162 bpm, and average heart rate was 61 bpm. Predominant underlying rhythm was Sinus Rhythm.  2 Ventricular Tachycardia runs occurred, the run with the fastest interval lasting 4 beats with a maximum rate of 162 bpm, the longest lasting 7 beats with an average rate of 122 bpm.  47 Supraventricular Tachycardia runs occurred, the run with the fastest interval lasting 5 beats with a maximum rate of 156 bpm, the longest lasting 27.9 seconds with an average rate of 127 bpm.   Premature atrial complexes were rare less than 1% Premature Ventricular complexes were rare less than 1%  No pauses, No AV block and no  atrial fibrillation present.   Conclusion: This study is remarkable for the following: 1. Nonsustained ventricular tachycardia 2. supraventricular tachycardia which is likely paroxysmal atrial tachycardia with variable block. Recent Labs: No results found for requested labs within last 8760 hours.  Recent Lipid Panel No results found for: CHOL, TRIG, HDL, CHOLHDL, VLDL, LDLCALC, LDLDIRECT  Physical Exam:    VS:  BP 138/68   Pulse 88   Ht 5\' 5"  (1.651 m)   Wt 172 lb 9.6 oz (78.3 kg)   SpO2 95%   BMI 28.72 kg/m     Wt Readings from Last 3 Encounters:  04/03/20 172 lb 9.6 oz (78.3 kg)  02/01/20 167 lb 6.4 oz (75.9 kg)  12/20/19 169  lb 12.8 oz (77 kg)     GEN: Well nourished, well developed in no acute distress HEENT: Normal NECK: No JVD; No carotid bruits LYMPHATICS: No lymphadenopathy CARDIAC: S1S2 noted,RRR, no murmurs, rubs, gallops RESPIRATORY:  Clear to auscultation without rales, wheezing or rhonchi  ABDOMEN: Soft, non-tender, non-distended, +bowel sounds, no guarding. EXTREMITIES: No edema, No cyanosis, no clubbing MUSCULOSKELETAL:  No deformity  SKIN: Warm and dry NEUROLOGIC:  Alert and oriented x 3, non-focal PSYCHIATRIC:  Normal affect, good insight  ASSESSMENT:    1. PAT (paroxysmal atrial tachycardia) ([place])   2. PVC (premature ventricular contraction)   3. Controlled type 2 diabetes mellitus with stage 4 chronic kidney disease, without long-term current use of insulin ([place])   4. CKD (chronic kidney disease) stage 4, GFR 15-29 ml/min (HCC)   5. Hyperlipidemia, mixed   6. History of CVA (cerebrovascular accident)    PLAN:    I am doing doing fine since I started this and started metoprolol.  But shortness of breath has improved significantly.  At this time she defers when moving forward with the stress test.  Continue to monitor.  Blood pressure is acceptable, continue with current antihypertensive regimen.   This is being managed by his primary care doctor.  No adjustments for antidiabetic medications were made today.  Avoid nephrotoxins in the setting of her chronic kidney disease.  Hyperlipidemia - continue with current statin medication.   The patient is in agreement with the above plan. The patient left the office in stable condition.  The patient will follow up in 1 year    Medication Adjustments/Labs and Tests Ordered: Current medicines are reviewed at length with the patient today.  Concerns regarding medicines are outlined above.  No orders of the defined types were placed in this encounter.  Meds ordered this encounter  Medications  . metoprolol succinate (TOPROL XL) 25 MG 24  hr tablet    Sig: Take 0.5 tablets (12.5 mg total) by mouth at bedtime.    Dispense:  45 tablet    Refill:  3    Patient Instructions  Medication Instructions:  Your physician recommends that you continue on your current medications as directed. Please refer to the Current Medication list given to you today.  *If you need a refill on your cardiac medications before your next appointment, please call your pharmacy*   Lab Work: None If you have labs (blood work) drawn today and your tests are completely normal, you will receive your results only by: Marland Kitchen MyChart Message (if you have MyChart) OR . A paper copy in the mail If you have any lab test that is abnormal or we need to change your treatment, we will call you to review the results.   Testing/Procedures: None  Follow-Up: At [facility], you and your health needs are our priority.  As part of our continuing mission to provide you with exceptional heart care, we have created designated Provider Care Teams.  These Care Teams include your primary Cardiologist (physician) and Advanced Practice Providers (APPs -  Physician Assistants and Nurse Practitioners) who all work together to provide you with the care you need, when you need it.  We recommend signing up for the patient portal called "MyChart".  Sign up information is provided on this After Visit Summary.  MyChart is used to connect with patients for Virtual Visits (Telemedicine).  Patients are able to view lab/test results, encounter notes, upcoming appointments, etc.  Non-urgent messages can be sent to your provider as well.   To learn more about what you can do with MyChart, go to [email or web address].    Your next appointment:   1 year(s)  The format for your next appointment:   In Person  Provider:   Berniece Salines, DO   Other Instructions      Adopting a Healthy Lifestyle.  Know what a healthy weight is for you (roughly BMI <25) and aim to maintain this    Aim for 7+ servings of fruits and vegetables daily   65-80+ fluid ounces of water or unsweet tea for healthy kidneys   Limit to max 1 drink of alcohol per day; avoid smoking/tobacco   Limit animal fats in diet for cholesterol and heart health - choose grass fed whenever available   Avoid highly processed foods, and foods high in saturated/trans fats   Aim for low stress - take time to unwind and care for your mental health   Aim for 150 min of moderate intensity exercise weekly for heart health, and weights twice weekly for bone health   Aim for 7-9 hours of sleep daily   When it comes to diets, agreement about the perfect plan isnt easy to find, even among the experts. Experts at the [employer] developed an idea known as the Healthy Eating Plate. Just imagine a plate divided into logical, healthy portions.   The emphasis is on diet quality:   Load up on vegetables and fruits - one-half of your plate: Aim for color and variety, and remember that potatoes dont count.   Go for whole grains - one-quarter of your plate: Whole wheat, barley, wheat berries, quinoa, oats, brown rice, and foods made with them. If you want pasta, go with whole wheat pasta.   Protein power - one-quarter of your plate: Fish, chicken, beans, and nuts are all healthy, versatile protein sources. Limit red meat.   The diet, however, does go beyond the plate, offering a few other suggestions.   Use healthy plant oils, such as olive, canola, soy, corn, sunflower and peanut. Check the labels, and avoid partially hydrogenated oil, which have unhealthy trans fats.   If youre thirsty, drink water. Coffee and tea are good in moderation, but skip sugary drinks and limit milk and dairy products to one or two daily servings.   The type of carbohydrate in the diet is more important than the amount. Some sources of carbohydrates, such as vegetables, fruits, whole grains, and beans-are healthier than  others.   Finally, stay active  Signed, Berniece Salines, DO  04/03/2020 12:52 PM    [facility]

## 2021-06-03 ENCOUNTER — Other Ambulatory Visit: Payer: Self-pay | Admitting: Cardiology

## 2021-09-03 ENCOUNTER — Other Ambulatory Visit: Payer: Self-pay | Admitting: Cardiology

## 2022-06-29 IMAGING — US US RENAL
1 series · 14 of 25 positions shown · non-contrast
Comparison: CT 11/15/2013

CLINICAL DATA: Chronic kidney disease stage IV

EXAM:
RENAL / URINARY TRACT ULTRASOUND COMPLETE

[Series 1: us renal · 0.23mm/px · 14 of 50 slices shown]
[im 1/50]
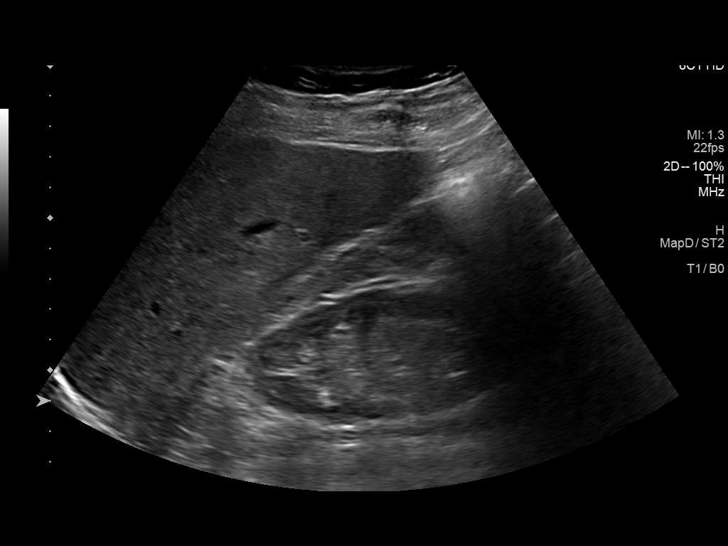
[im 5/50]
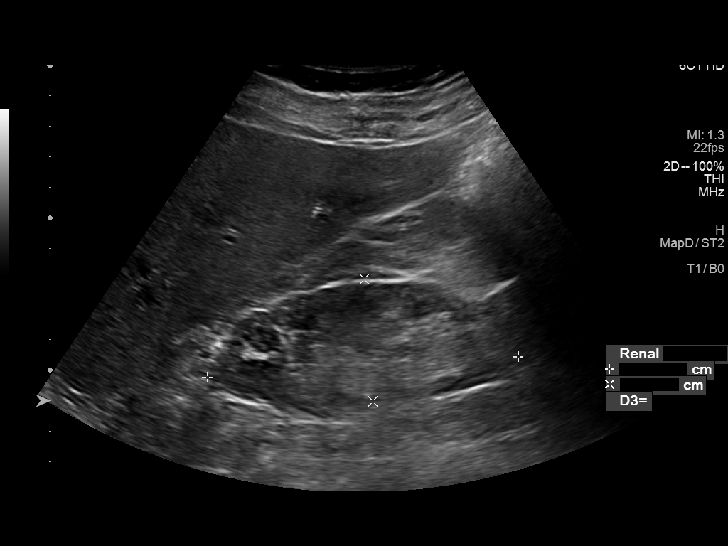
[im 9/50]
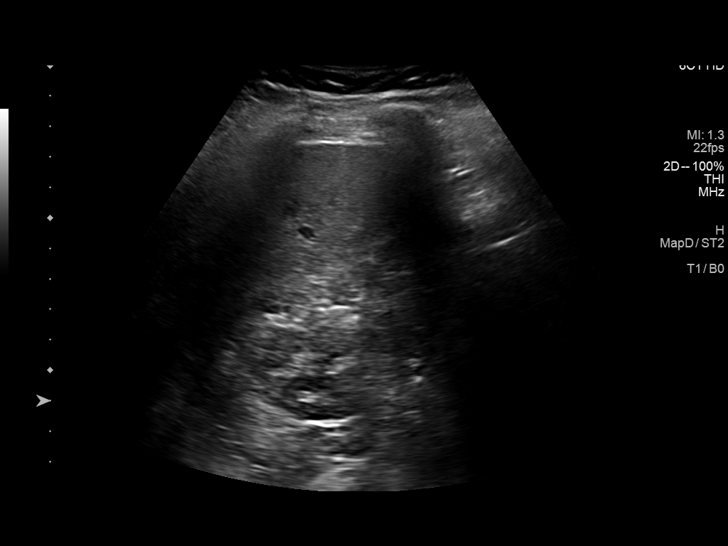
[im 13/50]
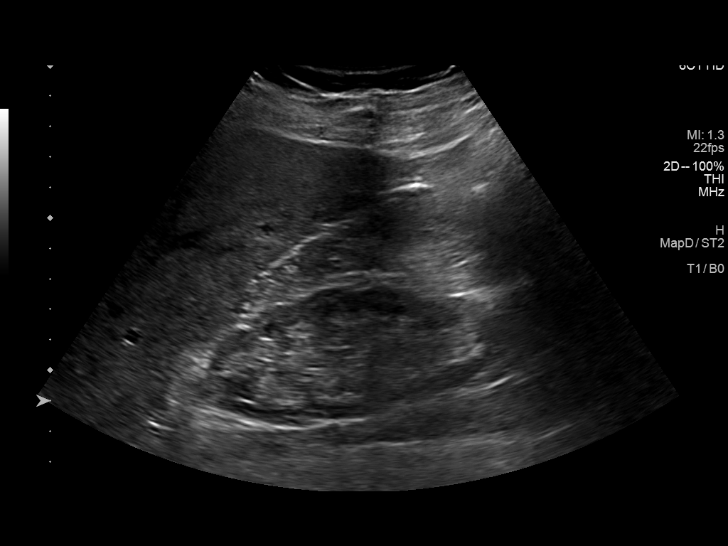
[im 17/50]
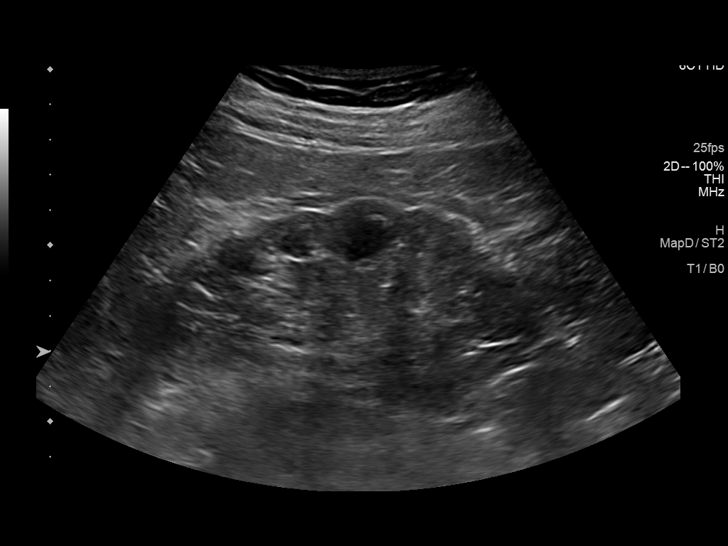
[im 19/50]
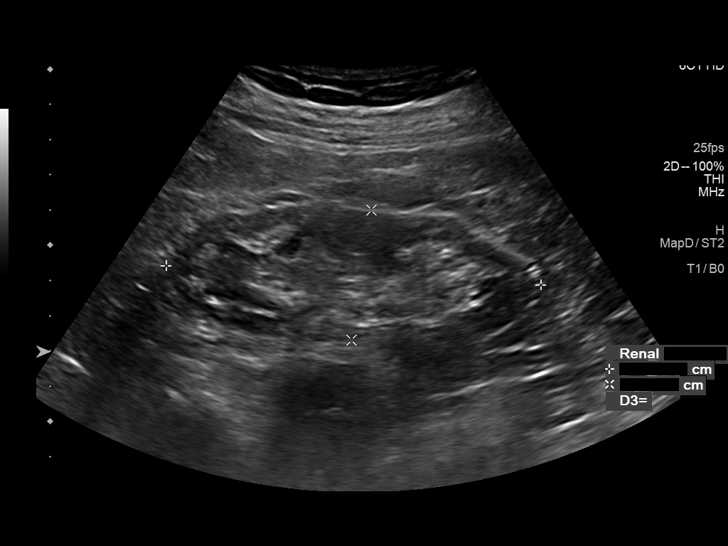
[im 23/50]
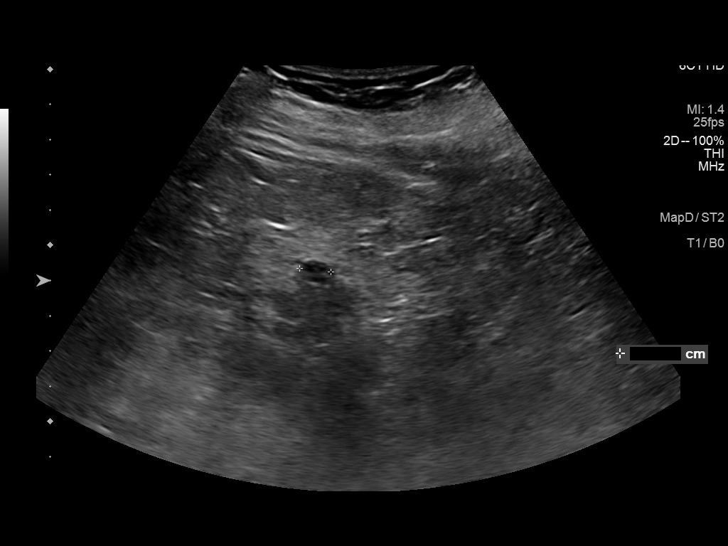
[im 27/50]
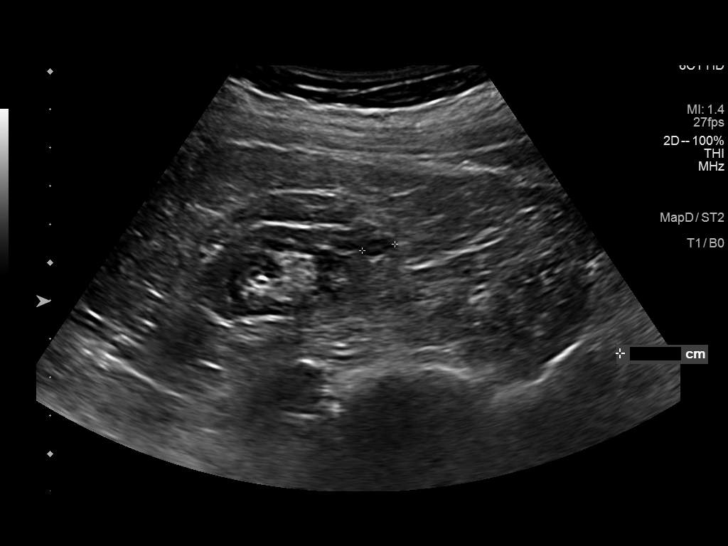
[im 31/50]
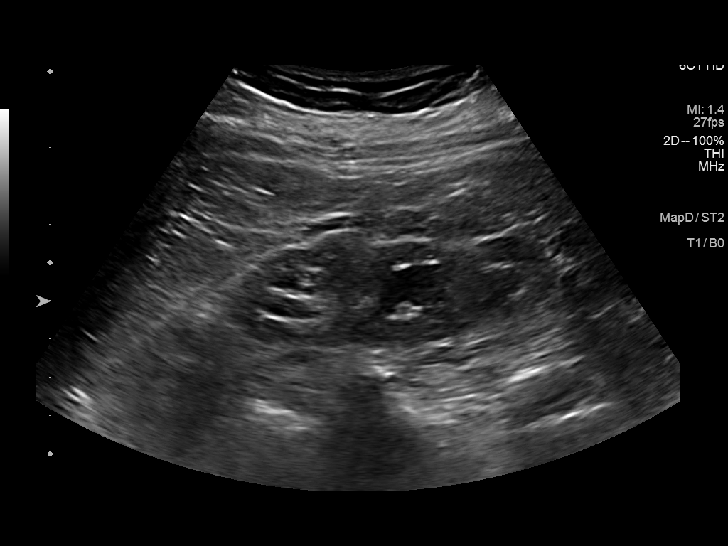
[im 33/50]
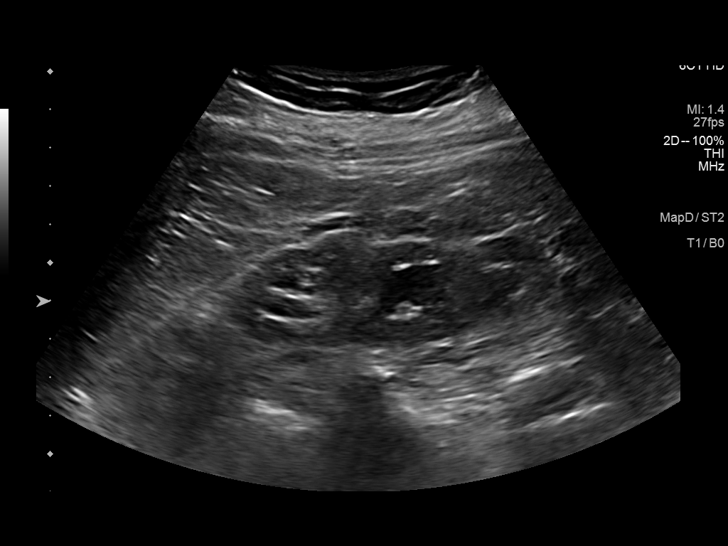
[im 37/50]
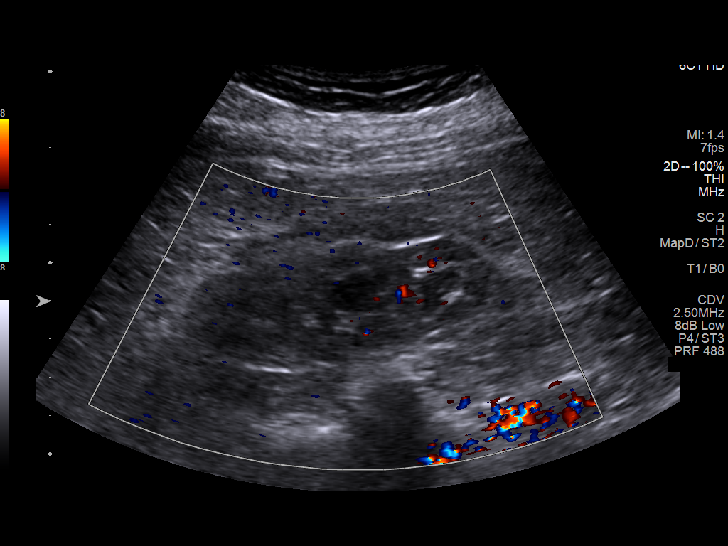
[im 41/50]
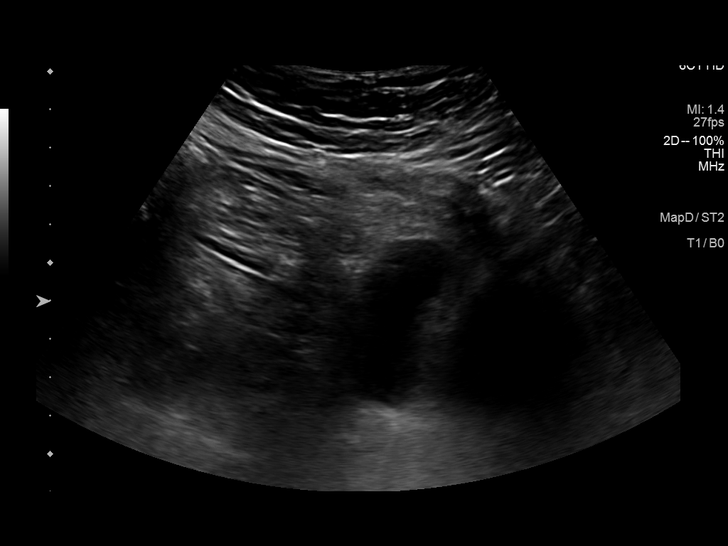
[im 45/50]
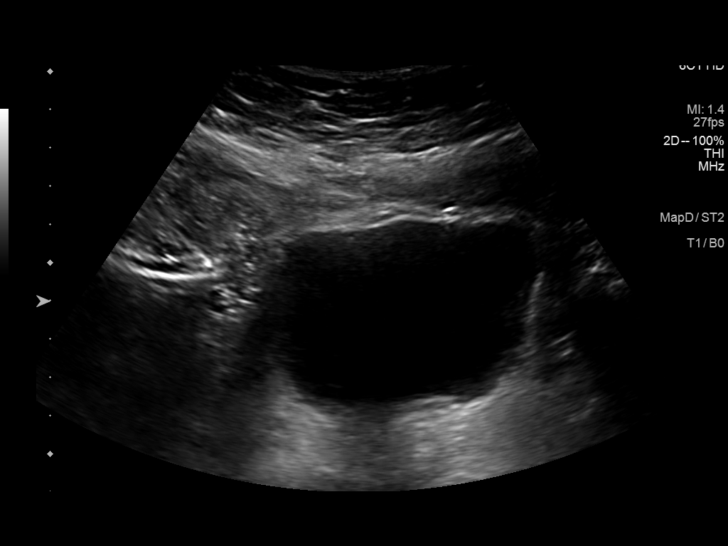
[im 50/50]
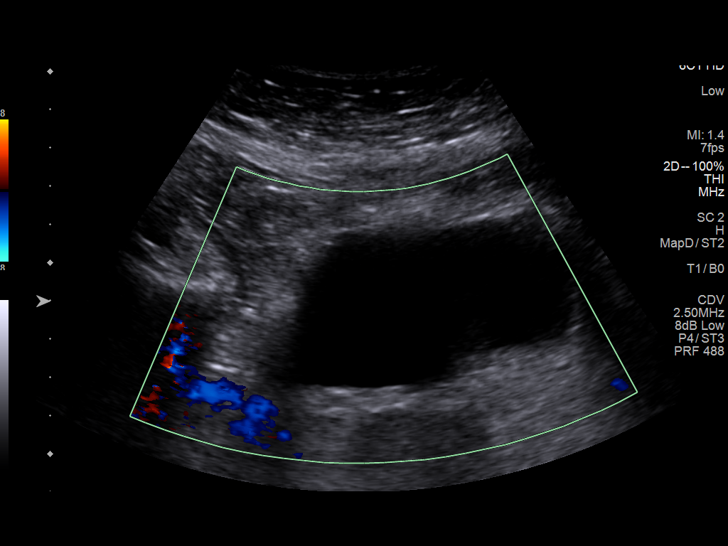

[14 of 25 positions shown; findings below may reference images not displayed]

FINDINGS: Right Kidney:

Renal measurements: 10.2 x 4 x 4.2 cm = volume: 89.3 mL. Slight
increased cortical echogenicity. Mild diffuse renal cortical
thickening. No hydronephrosis. No mass

Left Kidney:

Renal measurements: 10.6 x 3.7 x 4.3 cm = volume: 89.1 mL. Increased
cortical echogenicity. No hydronephrosis. Complex cyst upper pole
measuring 7 x 7 x 9 mm. Complex cyst in the lower pole measuring 11
x 7 x 9 mm. Complex cyst in the lower pole measuring 1.6 x 1 x 1 cm.

Bladder:

Appears normal for degree of bladder distention.

Other:

None.
IMPRESSION: 1. Echogenic kidneys consistent with medical renal disease. No
hydronephrosis
2. Complex appearing cysts in the left kidney, suggest 6 month
sonographic follow-up for monitoring.

## 2023-08-19 NOTE — Progress Notes (Addendum)
 Monica CHRISTELLA Blanch, MD, FAAFP   [facility]  - [facility] [address] [place] [address] Dept: [phone]  Dept Fax: [phone]  Monica Vega DOB: [birth year]  Encounter Date: 08/19/2023    Chief Complaint  Patient presents with  . Medicare Annual Wellness Visit Subsequent  . Chronic Kidney Disease  . Diabetes  . Hypothyroidism  . Hypertension  . MGUS  . Medication Management    Assessment/Plan   1. Encounter for Medicare annual wellness exam   2. Controlled type 2 diabetes mellitus with stage 4 chronic kidney disease, without long-term current use of insulin (HCC)   3. Hyperparathyroidism, secondary renal (HCC)   4. Hypothyroidism (acquired)   5. Benign hypertension with chronic kidney disease, stage IV (HCC)   6. MGUS (monoclonal gammopathy of unknown significance)   7. Osteoporosis, unspecified osteoporosis type, unspecified pathological fracture presence       PLAN:  Continue nephrology follow up. For CKD IIIb-IV.  Thyroid surveillance.   Diabetes surveillance. MGUS surveillance.  Diabetes is diet controlled.  BP is well controlled.  Diabetic eye, foot, and diet care recommended. Continue current medication.  Scheduled Future Appointments       Provider Department Dept Phone Center   02/21/2024 11:00 AM Monica Vega [facility]  - [facility] [place] [facility] [phone] [facility] [address]   08/18/2024 1:20 PM Ernani Berneda [facility]  - [facility] [place] [facility] [phone] [address]       Subjective  Monica Vega is a 89 y.o. female who presents for  Chief Complaint  Patient presents with  . Medicare Annual Wellness Visit Subsequent  . Chronic Kidney Disease  . Diabetes  . Hypothyroidism  . Hypertension  . MGUS  . Medication Management  .  Issues addressed by patient and physician today include:  PROBLEM #1:  DM2.  HTN.   CKD.  MGUS.  TSH.  PTH.   Lives alone at home.  Drives, shops, cooks, and manages her checkbook.  No falls.  Denies angina or new DOE.  Sees [facility] for CKD IV.  Denies uremic symptoms.  DM2 is diet controlled.  A1c goal 8% or less.  Office BP is well controlled.  Home BP log reviewed.  Due for lab surveillance.  Taking meds as recommended.  Review of Systems  Constitutional:  Negative for fever.  HENT:  Positive for hearing loss. Negative for trouble swallowing.   Respiratory:  Negative for shortness of breath.   Cardiovascular:  Negative for chest pain.  Gastrointestinal:  Negative for abdominal pain.  Genitourinary:  Negative for difficulty urinating.  Neurological:  Negative for weakness.  Psychiatric/Behavioral:  Negative for decreased concentration.     Objective  BP 131/82 (BP Location: Left arm, Patient Position: Sitting)   Pulse 67   Temp 98 F (36.7 C) (Temporal)   Ht 1.651 m (5' 5)   Wt 75.5 kg (166 lb 6.4 oz)   SpO2 100%   BMI 27.69 kg/m  BP Readings from Last 3 Encounters:  08/19/23 131/82  01/28/23 138/78  12/09/22 132/82   Pulse Readings from Last 3 Encounters:  08/19/23 67  01/28/23 58  12/09/22 61   Wt Readings from Last 3 Encounters:  08/19/23 75.5 kg (166 lb 6.4 oz)  01/28/23 76.2 kg (168 lb)  12/09/22 75.5 kg (166 lb 6.4 oz)   Ht Readings from Last 3 Encounters:  08/19/23 1.651 m (5' 5)  01/28/23 1.651 m (5' 5)  12/09/22 1.651 m (5' 5)   Body mass index is 27.69 kg/m. No LMP recorded. Patient has had a hysterectomy.  PHYSICAL EXAM: GENERAL APPEARANCE: Well appearing, well developed, overweight, NAD NECK: Supple without lymphadenopathy or thyromegaly LUNGS: Clear to auscultation bilaterally HEART: Regular rate and rhythm. ABDOMEN: Normal bowel sounds, soft, non-tender, without hepato-splenomegaly THORAX: symmetrical without CVAT. EXTREMITIES: Without clubbing, cyanosis, or edema NEUROLOGIC: Alert and oriented x 3.  Answered  questions appropriately.  Labs: No visits with results within 7 Day(s) from this visit.  Latest known visit with results is:  Office Visit on 01/28/2023  Component Date Value Ref Range Status  . Sodium 01/28/2023 140  136 - 145 mmol/L Final  . Potassium 01/28/2023 4.2  3.5 - 5.1 mmol/L Final   NO VISIBLE HEMOLYSIS  . Chloride 01/28/2023 104  98 - 107 mmol/L Final  . CO2 01/28/2023 27  21 - 31 mmol/L Final  . Anion Gap 01/28/2023 9  6 - 14 mmol/L Final  . Glucose, Random 01/28/2023 77  70 - 99 mg/dL Final  . Blood Urea Nitrogen (BUN) 01/28/2023 20  7 - 25 mg/dL Final  . Creatinine 98/76/7974 1.51 (H)  0.60 - 1.20 mg/dL Final  . eGFR 98/76/7974 33 (L)  >59 mL/min/1.56m2 Final   GFR estimated by CKD-EPI equations(NKF 2021).   Recommend confirmation of Cr-based eGFR by using Cys-based eGFR and other filtration markers (if applicable) in complex cases and clinical decision-making, as needed.  . Calcium 01/28/2023 9.8  8.6 - 10.3 mg/dL Final  . BUN/Creatinine Ratio 01/28/2023 13.2  10.0 - 20.0 Final  . Magnesium 01/28/2023 1.6 (L)  1.9 - 2.7 mg/dL Final  . Phosphorus 98/76/7974 3.7  2.5 - 5.0 mg/dL Final  . Hemoglobin J8r 01/28/2023 5.9 (H)  <5.7 % Final   Normal A1c:             Less than 5.7%  Prediabetes range A1c:  5.7% to 6.4%  Diabetes range A1c:     Greater than 6.4%  . Estimated Average Glucose 01/28/2023 123  mg/dL Final   The ADA has supported the calculation of Average Glucose (eAG) based on HbA1c measurements. However, the eAG reflects the average glycemic level over 2-3 months and it would not necessarily match single glucose laboratory measurements, nor reflect changes in the daily glucose concentration.  . Uric Acid 01/28/2023 3.9  2.3 - 6.6 mg/dL Final  . TSH 98/76/7974 2.266  0.450 - 5.330 uIU/mL Final  . Albumin, Urine 01/28/2023 66  Not Established mg/L Final  . Creatinine, Urine 01/28/2023 89  >=20 mg/dL Final  . Albumin/Creatinine Ratio, Urine 01/28/2023 74 (H)  0 -  30 mg/g creat Final   Normal: <30 mcgr albumin/gr Cr  Moderately increased: 30-299  Clinical albuminuria: >=300  . WBC 01/28/2023 6.30  4.40 - 11.00 10*3/uL Final  . RBC 01/28/2023 4.46  4.10 - 5.10 10*6/uL Final  . Hemoglobin 01/28/2023 12.3  12.3 - 15.3 g/dL Final  . Hematocrit 98/76/7974 38.4  35.9 - 44.6 % Final  . Mean Corpuscular Volume (MCV) 01/28/2023 86.0  80.0 - 96.0 fL Final  . Mean Corpuscular Hemoglobin (MCH) 01/28/2023 27.6  27.5 - 33.2 pg Final  . Mean Corpuscular Hemoglobin Conc (* 01/28/2023 32.0 (L)  33.0 - 37.0 g/dL Final  . Red Cell Distribution Width (RDW) 01/28/2023 15.6  12.3 - 17.0 % Final  . Platelet Count (PLT) 01/28/2023 222  150 - 450  10*3/uL Final  . Mean Platelet Volume (MPV) 01/28/2023 9.0  6.8 - 10.2 fL Final  . Neutrophils % 01/28/2023 64  % Final  . Lymphocytes % 01/28/2023 28  % Final  . Monocytes % 01/28/2023 6  % Final  . Eosinophils % 01/28/2023 3  % Final  . Basophils % 01/28/2023 1  % Final  . nRBC % 01/28/2023 0  % Final  . Neutrophils Absolute 01/28/2023 4.00  1.80 - 7.80 10*3/uL Final  . Lymphocytes # 01/28/2023 1.70  1.00 - 4.80 10*3/uL Final  . Monocytes # 01/28/2023 0.30  0.00 - 0.80 10*3/uL Final  . Eosinophils # 01/28/2023 0.20  0.00 - 0.50 10*3/uL Final  . Basophils # 01/28/2023 0.10  0.00 - 0.20 10*3/uL Final  . nRBC Absolute 01/28/2023 0.00  <=0.00 10*3/uL Final    No orders of the defined types were placed in this encounter.   Orders Placed This Encounter  Procedures  . DEXA Bone Density with Spect Lumbar  . Hemoglobin A1C With Estimated Average Glucose  . TSH With Reflex To Free T4  . Urinalysis with Microscopic  . Albumin, Random Urine  . Magnesium  . Phosphorus  . Parathyroid Hormone (PTH), Intact  . Vitamin D, 25-Hydroxy  . Urinalysis with Microscopic  . CBC with Differential  . CBC with Differential    Requested Prescriptions    No prescriptions requested or ordered in this encounter     There are no  discontinued medications.    Problem List[1]  Health Maintenance Status       Date Due Completion Dates   Bone Density Scan Never done ---   Adult RSV (60+ Years or Pregnancy) (1 - 1-dose 75+ series) Never done ---   ZOSTER VACCINE (2 of 3) 04/30/2010 03/05/2010   DTaP/Tdap/Td Vaccines (2 - Td or Tdap) 07/29/2020 07/30/2010   COVID-19 Vaccine (1 - 2024-25 season) Never done ---   Influenza Vaccine (1) 08/06/2023 01/28/2023, 01/10/2020   Diabetes: Foot Exam 01/28/2024 01/28/2023, 01/28/2023   Comment on 01/09/2022: See Legacy System   Comprehensive Annual Visit 08/18/2024 08/19/2023, 07/21/2022   Medicare Annual Wellness (AWV) Subsequent Visits 08/18/2024 08/19/2023, 08/19/2023   Diabetes: Hemoglobin A1C 08/18/2024 08/19/2023, 08/19/2023   Depression Screening 08/18/2024 08/19/2023        Immunization History  Administered Date(s) Administered  . Influenza, High-dose Seasonal, Quadrivalent, Preservative Free 09/08/2018, 01/10/2020  . Influenza, high-dose, trivalent, PF 11/08/2015, 10/05/2016, 10/08/2017, 01/28/2023  . Influenza, split virus, trivalent, preservative 09/08/2018  . Pneumococcal Conjugate 13-Valent 03/16/2013  . Pneumococcal Polysaccharide Vaccine, 23 Valent (PNEUMOVAX-23) 2Y+ 01/05/2005  . TDAP VACCINE (BOOSTRIX,ADACEL) 7Y+ 07/30/2010  . Zoster, Live 03/05/2010    Surgical History[2]  Family History[3]  Tobacco Use History[4] Social History   Substance and Sexual Activity  Alcohol Use No   Social History   Substance and Sexual Activity  Drug Use No    Allergies[5]   Monica CHRISTELLA Blanch, MD, FAAFP  Encounter Date: 08/19/2023       [1] Patient Active Problem List Diagnosis  . CKD (chronic kidney disease) stage 4, GFR 15-29 ml/min (HCC)  . Benign hypertension with chronic kidney disease, stage IV (HCC)  . Hyperparathyroidism, secondary renal (HCC)  . Chronic idiopathic gout of multiple sites  . Abnormal TSH  . GERD (gastroesophageal reflux disease)  .  Osteopenia  . Migraine syndrome  . MGUS (monoclonal gammopathy of unknown significance)  . Controlled type 2 diabetes mellitus with stage 4 chronic kidney disease, without long-term current use of  insulin (HCC)  . Restless legs  . Rhinitis medicamentosa  . Acute right lumbar radiculopathy  . Post-cholecystectomy syndrome  . Hyperlipidemia, mixed  . Abnormal mammogram of left breast  . Hypertensive emergency  . B12 deficiency  . Encephalopathy, hypertensive  . PAT (paroxysmal atrial tachycardia)  . Hypothyroidism (acquired)  . Rotator cuff tendinitis, left  . AKI (acute kidney injury) (HCC)  . Primary osteoarthritis of right hand  . Fall at home  . Bloody discharge from left nipple  [2] Past Surgical History: Procedure Laterality Date  . APPENDECTOMY     Procedure: APPENDECTOMY  . BREAST LUMPECTOMY     Procedure: BREAST LUMPECTOMY  . CHOLECYSTECTOMY     Procedure: CHOLECYSTECTOMY  . HYSTERECTOMY      Procedure: HYSTERECTOMY  [3] Family History Problem Relation Name Age of Onset  . Ovarian cancer Mother    . Heart attack Father    [4] Social History Tobacco Use  Smoking Status Never  Smokeless Tobacco Never  [5] Allergies Allergen Reactions  . Floxin [Ofloxacin] Psychosis  . Penicillin Swelling  . Ace Inhibitors Rash  . Amlodipine Rash  . Atorvastatin Rash  . Erythromycin Base Rash
